# Patient Record
Sex: Male | Born: 1990 | Race: White | Hispanic: No | Marital: Single
Health system: Southern US, Community
[De-identification: ages and names within clinical notes are randomized; demographics above are authoritative.]

---

## 2020-07-14 ENCOUNTER — Emergency Department (HOSPITAL_COMMUNITY)
Admission: EM | Admit: 2020-07-14 | Discharge: 2020-07-14 | Disposition: A | Discharge status: Home / Self Care | Payer: Self-pay | Attending: Emergency Medicine | Admitting: Emergency Medicine

## 2020-07-14 ENCOUNTER — Emergency Department (HOSPITAL_COMMUNITY): Payer: Self-pay

## 2020-07-14 ENCOUNTER — Encounter (HOSPITAL_COMMUNITY): Payer: Self-pay

## 2020-07-14 DIAGNOSIS — S61411A Laceration without foreign body of right hand, initial encounter: Secondary | ICD-10-CM | POA: Insufficient documentation

## 2020-07-14 DIAGNOSIS — S61412A Laceration without foreign body of left hand, initial encounter: Secondary | ICD-10-CM

## 2020-07-14 LAB — RAPID HIV SCREEN (HIV 1/2 AB+AG)
HIV 1/2 Antibodies: NONREACTIVE
HIV-1 P24 Antigen - HIV24: NONREACTIVE

## 2020-07-14 LAB — HEPATITIS B SURFACE ANTIGEN: Hepatitis B Surface Ag: NONREACTIVE

## 2020-07-14 MED ORDER — LIDOCAINE HCL (PF) 1 % IJ SOLN
5.0000 mL | Freq: Once | INTRAMUSCULAR | Status: DC
  Filled 2020-07-14: qty 5

## 2020-07-14 MED ORDER — AMOXICILLIN-POT CLAVULANATE 875-125 MG PO TABS: 1.0000 | ORAL_TABLET | Freq: Two times a day (BID) | ORAL | 0 refills | Status: AC

## 2020-07-14 MED ORDER — AMOXICILLIN-POT CLAVULANATE 875-125 MG PO TABS
1.0000 | ORAL_TABLET | Freq: Once | ORAL | Status: DC
  Filled 2020-07-14: qty 1

## 2020-07-14 MED ORDER — LIDOCAINE HCL (PF) 1 % IJ SOLN
INTRAMUSCULAR | Status: AC
  Filled 2020-07-14: qty 30
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
## 2020-07-14 NOTE — ED Notes (Signed)
PA at bedside to assess lacerations Small laceration on right thumb bleeding controlled 1 inch lac below 1st knuckle, bleeding increases with movement.  1 inch lac below 2 knuckle.  Bleeding controlled with pressure dressing  CMS intact and pulses present. Pt states tolerable pain at this time  No other injuries noted on other parts of body.

## 2020-07-14 NOTE — ED Notes (Signed)
Pt attempted to leave hospital, he was walking on the hallways, exit was showed by hospital security, medical staff recommended to stay and give instructions to come back to room but pt refuse. Pt start to record staff badges and show bleeding wound on camera. Staff request pt to stop recording and stop waiving his active bleeding hand. Hospital security and GDP were on scene. Pt was brought back to room by [employer].
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
## 2020-07-14 NOTE — ED Notes (Signed)
Pt out of room looking for help getting phone working. Pt began to get upset with staff across the room, thinking they were talking about him.  Pt taken back into room and given RN phone to make a call.   A few minutes later pt came out of room the threw phone across room at staff giving report. Security passing by stopped to assist with pt.   Pt shouting at staff "I am leaving and putting all of this on facebook live". Pt follow by staff and security while attempting to get him to go back in room and get treatment. Pt continues to curse and yell but is brought back to room to get his wounds cleaned and sutured.
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
## 2020-07-14 NOTE — Discharge Instructions (Signed)
You have 4 stitches. I need you to clean the wounds twice a day and change dressings. You may take over-the-counter pain medication like ibuprofen and orTylenol every 6 as needed for pain please follow dosing the back of bottle. I also prescribed antibiotics please take as prescribed.  You have to have your stitches removed within the next 7 to 10 days please follow-up at urgent care, emergency department, or PCP to have them removed.  Come back to emergency department if you develop numbness or tingling in your hand, increased redness, swelling, discharge from your hand, fevers, chills, chest pain, shortness of breath, severe abdominal pain, nausea, vomiting, diarrhea.
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
